# Patient Record
Sex: Male | Born: 1995 | Race: White | Hispanic: Yes | Marital: Single | Smoking: Current some day smoker
Health system: Southern US, Community
[De-identification: ages and names within clinical notes are randomized; demographics above are authoritative.]

## PROBLEM LIST (undated history)

## (undated) DIAGNOSIS — R011 Cardiac murmur, unspecified: Secondary | ICD-10-CM

## (undated) HISTORY — DX: Cardiac murmur, unspecified: R01.1

## (undated) HISTORY — PX: TONSILLECTOMY: SUR1361

---
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
## 2014-06-22 ENCOUNTER — Emergency Department (HOSPITAL_COMMUNITY)
Admission: EM | Admit: 2014-06-22 | Discharge: 2014-06-22 | Disposition: A | Discharge status: Home / Self Care | Payer: Self-pay | Attending: Emergency Medicine | Admitting: Emergency Medicine

## 2014-06-22 ENCOUNTER — Encounter (HOSPITAL_COMMUNITY): Payer: Self-pay | Admitting: Emergency Medicine

## 2014-06-22 DIAGNOSIS — Z720 Tobacco use: Secondary | ICD-10-CM | POA: Insufficient documentation

## 2014-06-22 DIAGNOSIS — R1033 Periumbilical pain: Secondary | ICD-10-CM | POA: Insufficient documentation

## 2014-06-22 DIAGNOSIS — R112 Nausea with vomiting, unspecified: Secondary | ICD-10-CM | POA: Insufficient documentation

## 2014-06-22 DIAGNOSIS — R197 Diarrhea, unspecified: Secondary | ICD-10-CM | POA: Insufficient documentation

## 2014-06-22 LAB — CBC WITH DIFFERENTIAL/PLATELET
Basophils Absolute: 0 10*3/uL (ref 0.0–0.1)
Basophils Relative: 0 % (ref 0–1)
Eosinophils Absolute: 0.1 10*3/uL (ref 0.0–0.7)
Eosinophils Relative: 1 % (ref 0–5)
HCT: 44.3 % (ref 39.0–52.0)
Hemoglobin: 15.3 g/dL (ref 13.0–17.0)
Lymphocytes Relative: 18 % (ref 12–46)
Lymphs Abs: 1.7 10*3/uL (ref 0.7–4.0)
MCH: 31.7 pg (ref 26.0–34.0)
MCHC: 34.5 g/dL (ref 30.0–36.0)
MCV: 91.9 fL (ref 78.0–100.0)
Monocytes Absolute: 0.9 10*3/uL (ref 0.1–1.0)
Monocytes Relative: 10 % (ref 3–12)
Neutro Abs: 6.5 10*3/uL (ref 1.7–7.7)
Neutrophils Relative %: 71 % (ref 43–77)
Platelets: 295 10*3/uL (ref 150–400)
RBC: 4.82 MIL/uL (ref 4.22–5.81)
RDW: 12.1 % (ref 11.5–15.5)
WBC: 9.2 10*3/uL (ref 4.0–10.5)

## 2014-06-22 LAB — BASIC METABOLIC PANEL
Anion gap: 7 (ref 5–15)
BUN: 10 mg/dL (ref 6–23)
CO2: 28 mmol/L (ref 19–32)
Calcium: 9.4 mg/dL (ref 8.4–10.5)
Chloride: 104 mmol/L (ref 96–112)
Creatinine, Ser: 0.82 mg/dL (ref 0.50–1.35)
GFR calc Af Amer: >90 mL/min (ref >90)
GFR calc non Af Amer: >90 mL/min (ref >90)
Glucose, Bld: 110 mg/dL — ABNORMAL HIGH (ref 70–99)
Potassium: 3.9 mmol/L (ref 3.5–5.1)
Sodium: 139 mmol/L (ref 135–145)

## 2014-06-22 MED ORDER — SODIUM CHLORIDE 0.9 % IV BOLUS (SEPSIS)
1000.0000 mL | Freq: Once | INTRAVENOUS | Status: AC
  Administered 2014-06-22: 1000 mL via INTRAVENOUS

## 2014-06-22 MED ORDER — KETOROLAC TROMETHAMINE 15 MG/ML IJ SOLN
15.0000 mg | Freq: Once | INTRAMUSCULAR | Status: AC
  Administered 2014-06-22: 15 mg via INTRAVENOUS
  Filled 2014-06-22: qty 1

## 2014-06-22 MED ORDER — ONDANSETRON HCL 4 MG PO TABS: 4.0000 mg | ORAL_TABLET | Freq: Four times a day (QID) | ORAL | Status: DC

## 2014-06-22 MED ORDER — ONDANSETRON HCL 4 MG/2ML IJ SOLN
4.0000 mg | Freq: Once | INTRAMUSCULAR | Status: AC
  Administered 2014-06-22: 4 mg via INTRAVENOUS
  Filled 2014-06-22: qty 2

## 2014-06-22 NOTE — Discharge Instructions (Signed)
Abdominal Pain °Many things can cause abdominal pain. Usually, abdominal pain is not caused by a disease and will improve without treatment. It can often be observed and treated at home. Your health care provider will do a physical exam and possibly order blood tests and X-rays to help determine the seriousness of your pain. However, in many cases, more time must pass before a clear cause of the pain can be found. Before that point, your health care provider may not know if you need more testing or further treatment. °HOME CARE INSTRUCTIONS  °Monitor your abdominal pain for any changes. The following actions may help to alleviate any discomfort you are experiencing: °· Only take over-the-counter or prescription medicines as directed by your health care provider. °· Do not take laxatives unless directed to do so by your health care provider. °· Try a clear liquid diet (broth, tea, or water) as directed by your health care provider. Slowly move to a bland diet as tolerated. °SEEK MEDICAL CARE IF: °· You have unexplained abdominal pain. °· You have abdominal pain associated with nausea or diarrhea. °· You have pain when you urinate or have a bowel movement. °· You experience abdominal pain that wakes you in the night. °· You have abdominal pain that is worsened or improved by eating food. °· You have abdominal pain that is worsened with eating fatty foods. °· You have a fever. °SEEK IMMEDIATE MEDICAL CARE IF:  °· Your pain does not go away within 2 hours. °· You keep throwing up (vomiting). °· Your pain is felt only in portions of the abdomen, such as the right side or the left lower portion of the abdomen. °· You pass bloody or black tarry stools. °MAKE SURE YOU: °· Understand these instructions.   °· Will watch your condition.   °· Will get help right away if you are not doing well or get worse.   °Document Released: 01/14/2005 Document Revised: 04/11/2013 Document Reviewed: 12/14/2012 °ExitCare® Patient Information  ©2015 ExitCare, LLC. This information is not intended to replace advice given to you by your health care provider. Make sure you discuss any questions you have with your health care provider. ° °Nausea and Vomiting °Nausea means you feel sick to your stomach. Throwing up (vomiting) is a reflex where stomach contents come out of your mouth. °HOME CARE  °· Take medicine as told by your doctor. °· Do not force yourself to eat. However, you do need to drink fluids. °· If you feel like eating, eat a normal diet as told by your doctor. °¨ Eat rice, wheat, potatoes, bread, lean meats, yogurt, fruits, and vegetables. °¨ Avoid high-fat foods. °· Drink enough fluids to keep your pee (urine) clear or pale yellow. °· Ask your doctor how to replace body fluid losses (rehydrate). Signs of body fluid loss (dehydration) include: °¨ Feeling very thirsty. °¨ Dry lips and mouth. °¨ Feeling dizzy. °¨ Dark pee. °¨ Peeing less than normal. °¨ Feeling confused. °¨ Fast breathing or heart rate. °GET HELP RIGHT AWAY IF:  °· You have blood in your throw up. °· You have black or bloody poop (stool). °· You have a bad headache or stiff neck. °· You feel confused. °· You have bad belly (abdominal) pain. °· You have chest pain or trouble breathing. °· You do not pee at least once every 8 hours. °· You have cold, clammy skin. °· You keep throwing up after 24 to 48 hours. °· You have a fever. °MAKE SURE YOU:  °· Understand   these instructions.  Will watch your condition.  Will get help right away if you are not doing well or get worse. Document Released: 09/23/2007 Document Revised: 06/29/2011 Document Reviewed: 09/05/2010 [facility] HExitCare Patient Information 2015 [place], [place]. This information is not intended to replace advice given to you by your health care provider. Make sure you discuss any questions you have with your health care provider.   Emergency Department Resource Guide 1) Find a Doctor and Pay Out of Pocket Although you won't  have to find out who is covered by your insurance plan, it is a good idea to ask around and get recommendations. You will then need to call the office and see if the doctor you have chosen will accept you as a new patient and what types of options they offer for patients who are self-pay. Some doctors offer discounts or will set up payment plans for their patients who do not have insurance, but you will need to ask so you aren't surprised when you get to your appointment.  2) Contact Your Local Health Department Not all health departments have doctors that can see patients for sick visits, but many do, so it is worth a call to see if yours does. If you don't know where your local health department is, you can check in your phone book. The CDC also has a tool to help you locate your state's health department, and many state websites also have listings of all of their local health departments.  3) Find a Walk-in Clinic If your illness is not likely to be very severe or complicated, you may want to try a walk in clinic. These are popping up all over the country in pharmacies, drugstores, and shopping centers. They're usually staffed by nurse practitioners or physician assistants that have been trained to treat common illnesses and complaints. They're usually fairly quick and inexpensive. However, if you have serious medical issues or chronic medical problems, these are probably not your best option.  No Primary Care Doctor: - Call Health Connect at  [phone] - they can help you locate a primary care doctor that  accepts your insurance, provides certain services, etc. - Physician Referral Service- [phone]  Chronic Pain Problems: Organization         Address  Phone   Notes  [facility]  [phone] Patients need to be referred by their primary care doctor.   Medication Assistance: Organization         Address  Phone   Notes  [facility]  [address] [place], [place] [address] [phone]) [phone] --Must be a resident of [facility] -- Must have NO insurance coverage whatsoever (no Medicaid/ Medicare, etc.) -- The pt. MUST have a primary care doctor that directs their care regularly and follows them in the community   MedAssist  [phone]   [employer]  [phone]) [phone]    Agencies that provide inexpensive medical care: Organization         Address  Phone   Notes  [facility]  [phone]   [facility]    [phone]   [facility] [address] [place], [place] [address] [phone]) [phone]   [facility] [address], [place] [phone]   [facility]    [phone]   [facility]    [phone]   [facility]  [phone]  [address], [place] Phone:  [phone], Fax:  [phone] Hours of Operation:  9 am - 6 pm, M-F.  Also accepts Medicaid/Medicare and self-pay.  [facility]  [address], [place] Phone: [phone], Fax: [phone]. Hours of Operation:  8:30 am - 5:30 pm, M-F.  Also accepts Medicaid and self-pay.  [facility] [place] [address] [place] Phone: [phone]   [facility] [address] Natasha Bence [place], [place] [phone], Ext. [phone] Mondays & Thursdays: 7-9 AM.  First 15 patients are seen on a first come, first serve basis.    Medicaid-accepting [facility] Providers:  Organization         Address  Phone   Notes  [facility] [address], [place] [phone] Also accepts self-pay patients.  [facility] [address] Laurell Josephs [place], [place]  [phone]   [facility] [address], [place] [phone]   [facility] [address] [place] [phone]     Renaye Rakers [address], [place]   [phone] Only accepts [place] patients after they have their name applied to their card.   Self-Pay (no insurance) in [facility]:  Organization         Address  Phone   Notes  Sickle Cell Patients, [facility] [address] [place], [place] [phone]   [facility] [address] [place], [place] [phone]   [facility]  [address], [place] [phone]   [facility]/Dr. Osei-Bonsu  [address], [employer] or [address], [place] [phone] Phone number for both [place] and [place] locations is the same.  [facility] [address], [place] [phone]   [facility] [address], [place] or [address] [phone]   [facility] [address], [place] [phone], phone; [phone], fax Sees patients 1st and 3rd Saturday of every month.  Must not qualify for public or private insurance (i.e. Medicaid, Medicare, [place] Health Choice, Veterans' Benefits)  Household income should be no more than 200% of the poverty level The clinic cannot treat you if you are pregnant or think you are pregnant  Sexually transmitted diseases are not treated at the clinic.    Dental Care: Organization         Address  Phone  Notes  [facility] [address] [place], [place] [phone] Accepts children up to age 54 who are enrolled in [place] or [place] Health Choice; pregnant women with a Medicaid card; and children who have applied for Medicaid or [place] Health Choice, but were declined, whose parents can pay a reduced fee at time of service.  [facility] Department of [facility]  [address], [place] [phone] Accepts children  up to age 85 who are enrolled in [place] or [place] Health Choice; pregnant women with a Medicaid card; and children who have applied for Medicaid or [place] Health Choice, but were declined, whose parents can pay a reduced fee at time of service.  [facility] Adult Dental Access PROGRAM  [address] [place], [place] [phone]  Patients are seen by appointment only. Walk-ins are not accepted. [facility] will see patients 21 years of age and older. Monday - Tuesday (8am-5pm) Most Wednesdays (8:30-5pm) $30 per visit, cash only  [facility] Access PRO[facility]  [address], [facility] [place] [phone] Patients are seen by appointment only. Walk-ins are not accepted. [facility] will see patients 21 years of age and older. One Wednesday Evening (Monthly: Volunteer Based).  $30 per visit, cash only  [employer] [facility]  [phone]) [phone] for adults; Children under age 194, call Graduate Pediatric Dentistry at [phone]) [phone]. Children aged 114-14, please call [phone] to request a pediatric application.  Dental services are provided in all areas of dental care including fillings, crowns and bridges, complete and partial dentures, implants, gum treatment, root canals, and extractions. Preventive care is also provided. Treatment is provided to both adults and children. Patients are selected via a lottery and there is often a waiting list.   [facility] [address], [place]  [phone] [email or web address]   [facility] [address], [place], [place] [phone], Ext. [phone] Second and Fourth Thursday of each month, opens at 6:30 AM; Clinic ends at 9 AM.  Patients are seen on a first-come first-served basis, and a limited number are seen during each clinic.   [facility]  [address], [place], [place] [phone]) [phone]   Eligibility Requirements You must have lived in [place], [place], or [place] counties for at least the last  three months.   You cannot be eligible for state or federal sponsored National Cityhealthcare insurance, including CIGNAVeterans Administration, [place]icaid, or [employer] EntertainmentMedicare.   You generally cannot be eligible for healthcare insurance through your employer.    How to apply: Eligibility screenings are held every Tuesday and Wednesday afternoon from 1:00 pm until 4:00 pm. You do not need an appointment for the interview!  [facility] [address], [place], [place] [phone]   [facility]  [phone]   [facility]  [phone]   [facility]  [phone]    Behavioral Health Resources in the Community: Intensive Outpatient Programs Organization         Address  Phone  Notes  [facility] [address], [place], [place] [phone]   [facility] [address], [place], [place] [phone]   [facility] [address], [place], [place]  [phone]   [facility] [address],  [place], [place] [phone] or [phone]   Substance Abuse Resources Organization         Address  Phone  Notes  [facility]  [phone]   [facility]  [phone]   [facility]  [phone]   [facility]  [phone]   Residential & Outpatient Substance Abuse Program  [phone]   Psychological Services Organization         Address  Phone  Notes  [facility]  [phone]   [facility]  [phone]   [facility] [address], [place] [phone] or [phone]    Mobile Crisis Teams Organization         Address  Phone  Notes  [facility], Mobile Crisis Care Unit  [phone]   [facility]otherapeutic Services  [address] [place], [place] [phone]   [facility] [address], [place] 4118  [place] [phone]     Self-Help/Support Groups Organization         Address  Phone             Notes  [facility] [place] - variety of support groups  Kila Call for more information  Narcotics Anonymous (NA), [facility] [address], [employer] [place]  2 meetings at this location   Special educational needs teacher         Address  Phone  Notes  ASAP Residential Treatment Halfway,    [place]  [phone]   [facility]  [address], [place] [phone], [place], [place]   [place], [place] [phone] Admissions: 8am-3pm M-F  Incentives Substance [place] [address],    [place], [place] [phone]   [facility] [address] [place], [place], [place]   [facility] [address]  [place], [place]   [facility] Programs - Intensive Outpatient [address], [place], [place]   [facility] ([place] [address].,  [place], [place] [phone] or [phone]   [facility] (RTS) [address] [place], [place] Accepts Medicaid  [facility] [place] [address]  [place] [phone] Substance Abuse/Addiction Treatment   [facility] Organization         Address  Phone  Notes  [facility]  [phone]   Domenic Schwab, PhD [address] Arlis Porta [place], [place]   [phone] or [phone]   [place], [place] [phone]   [facility] [address] [place], [place] [phone] Insurance/Medicaid/sponsorship through [facility] and Families [address]                                    [place], [place] [phone] [place] [address], [place] [phone]    Dr. Adele Schilder  [phone]   Free  [facility] of [place] Dept. 1) [address], [place] 2) [place] 3)  [address], [place] [phone]  [phone]   [place] [phone] or [phone] (After Hours)

## 2014-06-22 NOTE — ED Notes (Signed)
Pt arrived with father to ED for evaluation of abd pain and vomiting. Pt stated that his abdominal pain stared Monday and then Wednesday he started vomiting. Stated that he vomited atleast 4 times over night. Denies any diarrhea. Also stated that he vomited last night around 7 and after he vomited his upper abd felt really cold.

## 2014-06-23 NOTE — ED Provider Notes (Signed)
CSN: [id number]     Arrival date & time 06/22/14  0941 History   First MD Initiated Contact with Patient 06/22/14 0945     Chief Complaint  Patient presents with  . Emesis  . Abdominal Pain     (Consider location/radiation/quality/duration/timing/severity/associated sxs/prior Treatment) Patient is a 19 y.o. male presenting with vomiting and abdominal pain. The history is provided by the patient.  Emesis Severity:  Mild Duration:  4 days Timing:  Intermittent Quality:  Stomach contents Chronicity:  New Recent urination:  Normal Context: not post-tussive and not self-induced   Relieved by:  Nothing Worsened by:  Food smell Associated symptoms: abdominal pain and diarrhea   Associated symptoms: no chills, no fever, no myalgias, no sore throat and no URI   Risk factors: no alcohol use, no diabetes, no sick contacts, no suspect food intake and no travel to endemic areas   Abdominal Pain Associated symptoms: diarrhea and vomiting   Associated symptoms: no chills and no sore throat     History reviewed. No pertinent past medical history. Past Surgical History  Procedure Laterality Date  . Tonsillectomy     No family history on file. History  Substance Use Topics  . Smoking status: Current Every Day Smoker -- 0.50 packs/day    Types: Cigarettes  . Smokeless tobacco: Not on file  . Alcohol Use: No    Review of Systems  Constitutional: Negative for chills.  HENT: Negative for sore throat.   Gastrointestinal: Positive for vomiting, abdominal pain and diarrhea.  Musculoskeletal: Negative for myalgias.    All systems reviewed and negative, other than as noted in HPI.   Allergies  Review of patient's allergies indicates no known allergies.  Home Medications   Prior to Admission medications   Medication Sig Start Date End Date Taking? Authorizing Provider  ibuprofen (ADVIL,MOTRIN) 200 MG tablet Take 200 mg by mouth every 6 (six) hours as needed.   Yes Historical Provider,  MD  ondansetron (ZOFRAN) 4 MG tablet Take 1 tablet (4 mg total) by mouth every 6 (six) hours. 06/22/14   Raeford RazorStephen Emannuel Vise, MD   BP 136/66 mmHg  Pulse 74  Temp(Src) 98.7 F (37.1 C) (Oral)  Resp 18  SpO2 98% Physical Exam  Constitutional: He appears well-developed and well-nourished. No distress.  HENT:  Head: Normocephalic and atraumatic.  Eyes: Conjunctivae are normal. Right eye exhibits no discharge. Left eye exhibits no discharge.  Neck: Neck supple.  Cardiovascular: Normal rate, regular rhythm and normal heart sounds.  Exam reveals no gallop and no friction rub.   No murmur heard. Pulmonary/Chest: Effort normal and breath sounds normal. No respiratory distress.  Abdominal: Soft. He exhibits no distension. There is no tenderness.  Musculoskeletal: He exhibits no edema or tenderness.  No cva tenderness  Neurological: He is alert.  Skin: Skin is warm and dry.  Psychiatric: He has a normal mood and affect. His behavior is normal. Thought content normal.  Nursing note and vitals reviewed.   ED Course  Procedures (including critical care time) Labs Review Labs Reviewed  BASIC METABOLIC PANEL - Abnormal; Notable for the following:    Glucose, Bld 110 (*)    All other components within normal limits  CBC WITH DIFFERENTIAL/PLATELET    Imaging Review No results found.   EKG Interpretation None      MDM   Final diagnoses:  Non-intractable vomiting with nausea, vomiting of unspecified type  Periumbilical abdominal pain    18yM with abdominal pain and n/v. Abdominal exam  is pretty benign. Afebrile. HD stable. I generally have a very low suspicion for appendicitis or other acute surgical process. May be viral enteritis. Labs without significant metabolic derangement. Feels better after IVF/zofran. I feel safe for DC. REturn precautions discussed.     Raeford Razor, MD 06/23/14 [phone]

## 2014-08-08 ENCOUNTER — Emergency Department (HOSPITAL_COMMUNITY): Payer: Self-pay

## 2014-08-08 ENCOUNTER — Emergency Department (HOSPITAL_COMMUNITY)
Admission: EM | Admit: 2014-08-08 | Discharge: 2014-08-08 | Disposition: A | Discharge status: Home / Self Care | Payer: Self-pay | Attending: Emergency Medicine | Admitting: Emergency Medicine

## 2014-08-08 ENCOUNTER — Encounter (HOSPITAL_COMMUNITY): Payer: Self-pay | Admitting: Emergency Medicine

## 2014-08-08 DIAGNOSIS — R112 Nausea with vomiting, unspecified: Secondary | ICD-10-CM | POA: Insufficient documentation

## 2014-08-08 DIAGNOSIS — R1013 Epigastric pain: Secondary | ICD-10-CM | POA: Insufficient documentation

## 2014-08-08 DIAGNOSIS — R1011 Right upper quadrant pain: Secondary | ICD-10-CM | POA: Insufficient documentation

## 2014-08-08 DIAGNOSIS — Z720 Tobacco use: Secondary | ICD-10-CM | POA: Insufficient documentation

## 2014-08-08 DIAGNOSIS — R197 Diarrhea, unspecified: Secondary | ICD-10-CM | POA: Insufficient documentation

## 2014-08-08 LAB — COMPREHENSIVE METABOLIC PANEL
ALBUMIN: 4.3 g/dL (ref 3.5–5.2)
ALT: 18 U/L (ref 0–53)
AST: 18 U/L (ref 0–37)
Alkaline Phosphatase: 76 U/L (ref 39–117)
Anion gap: 10 (ref 5–15)
BILIRUBIN TOTAL: 0.8 mg/dL (ref 0.3–1.2)
BUN: 13 mg/dL (ref 6–23)
CHLORIDE: 102 mmol/L (ref 96–112)
CO2: 26 mmol/L (ref 19–32)
Calcium: 9.4 mg/dL (ref 8.4–10.5)
Creatinine, Ser: 0.89 mg/dL (ref 0.50–1.35)
GFR calc Af Amer: >90 mL/min (ref >90)
GFR calc non Af Amer: >90 mL/min (ref >90)
Glucose, Bld: 108 mg/dL — ABNORMAL HIGH (ref 70–99)
Potassium: 4.5 mmol/L (ref 3.5–5.1)
SODIUM: 138 mmol/L (ref 135–145)
Total Protein: 7.1 g/dL (ref 6.0–8.3)

## 2014-08-08 LAB — CBC WITH DIFFERENTIAL/PLATELET
BASOS PCT: 0 % (ref 0–1)
Basophils Absolute: 0 10*3/uL (ref 0.0–0.1)
EOS ABS: 0.3 10*3/uL (ref 0.0–0.7)
Eosinophils Relative: 4 % (ref 0–5)
HEMATOCRIT: 45.8 % (ref 39.0–52.0)
HEMOGLOBIN: 15.9 g/dL (ref 13.0–17.0)
LYMPHS ABS: 2.9 10*3/uL (ref 0.7–4.0)
Lymphocytes Relative: 35 % (ref 12–46)
MCH: 31.5 pg (ref 26.0–34.0)
MCHC: 34.7 g/dL (ref 30.0–36.0)
MCV: 90.7 fL (ref 78.0–100.0)
MONO ABS: 0.8 10*3/uL (ref 0.1–1.0)
MONOS PCT: 10 % (ref 3–12)
Neutro Abs: 4.4 10*3/uL (ref 1.7–7.7)
Neutrophils Relative %: 51 % (ref 43–77)
Platelets: 314 10*3/uL (ref 150–400)
RBC: 5.05 MIL/uL (ref 4.22–5.81)
RDW: 11.8 % (ref 11.5–15.5)
WBC: 8.5 10*3/uL (ref 4.0–10.5)

## 2014-08-08 LAB — LIPASE, BLOOD: Lipase: 26 U/L (ref 11–59)

## 2014-08-08 MED ORDER — IOHEXOL 300 MG/ML  SOLN
25.0000 mL | Freq: Once | INTRAMUSCULAR | Status: AC | PRN
  Administered 2014-08-08: 25 mL via ORAL

## 2014-08-08 MED ORDER — ONDANSETRON HCL 4 MG/2ML IJ SOLN
4.0000 mg | Freq: Once | INTRAMUSCULAR | Status: AC
  Administered 2014-08-08: 4 mg via INTRAVENOUS
  Filled 2014-08-08: qty 2

## 2014-08-08 MED ORDER — TRAMADOL HCL 50 MG PO TABS: 50.0000 mg | ORAL_TABLET | Freq: Four times a day (QID) | ORAL | Status: DC | PRN

## 2014-08-08 MED ORDER — IOHEXOL 300 MG/ML  SOLN
100.0000 mL | Freq: Once | INTRAMUSCULAR | Status: AC | PRN
  Administered 2014-08-08: 100 mL via INTRAVENOUS

## 2014-08-08 MED ORDER — SODIUM CHLORIDE 0.9 % IV BOLUS (SEPSIS)
1000.0000 mL | Freq: Once | INTRAVENOUS | Status: AC
  Administered 2014-08-08: 1000 mL via INTRAVENOUS

## 2014-08-08 NOTE — ED Notes (Signed)
Pt states that he has been having emesis and abdominal pain for 2 days. Pt states that he was feeling better yesterday, so he ate a cheeseburger, but he woke up with worse pain this AM.

## 2014-08-08 NOTE — ED Notes (Signed)
Pt verbalized understanding of d/c instructions and has no further questions.  

## 2014-08-08 NOTE — ED Provider Notes (Signed)
CSN: [id number]     Arrival date & time 08/08/14  2536 History   First MD Initiated Contact with Patient 08/08/14 0455     Chief Complaint  Patient presents with  . Abdominal Pain     (Consider location/radiation/quality/duration/timing/severity/associated sxs/prior Treatment) HPI Comments: Patient is a 19 year old male who presents for evaluation of right-sided abdominal pain. This started several hours prior to presentation. For the past 2 or 3 days he reports vomiting, nausea, and loose stools. He woke up this morning with the pain. He denies any fevers or chills. He had a similar episode one month ago for which she was seen here and discharged.  Patient is a 19 y.o. male presenting with abdominal pain. The history is provided by the patient.  Abdominal Pain Pain location:  Epigastric and RUQ Pain quality: cramping   Pain radiates to:  Does not radiate Pain severity:  Moderate Onset quality:  Sudden Duration:  3 hours Timing:  Constant Progression:  Worsening Chronicity:  New Relieved by:  Nothing Worsened by:  Nothing tried Ineffective treatments:  None tried Associated symptoms: diarrhea, nausea and vomiting   Associated symptoms: no constipation and no fever     History reviewed. No pertinent past medical history. Past Surgical History  Procedure Laterality Date  . Tonsillectomy     History reviewed. No pertinent family history. History  Substance Use Topics  . Smoking status: Current Some Day Smoker -- 0.50 packs/day    Types: Cigarettes  . Smokeless tobacco: Not on file  . Alcohol Use: No    Review of Systems  Constitutional: Negative for fever.  Gastrointestinal: Positive for nausea, vomiting, abdominal pain and diarrhea. Negative for constipation.  All other systems reviewed and are negative.     Allergies  Review of patient's allergies indicates no known allergies.  Home Medications   Prior to Admission medications   Medication Sig Start Date End  Date Taking? Authorizing Provider  ibuprofen (ADVIL,MOTRIN) 200 MG tablet Take 200 mg by mouth every 6 (six) hours as needed.    Historical Provider, MD  ondansetron (ZOFRAN) 4 MG tablet Take 1 tablet (4 mg total) by mouth every 6 (six) hours. 06/22/14   Raeford Razor, MD   BP 141/75 mmHg  Pulse 72  Temp(Src) 98.4 F (36.9 C) (Oral)  Resp 18  Ht  (1.93 m)  Wt 333 lb 12.8 oz (151.411 kg)  BMI 40.65 kg/m2  SpO2 100% Physical Exam  Constitutional: He is oriented to person, place, and time. He appears well-developed and well-nourished. No distress.  HENT:  Head: Normocephalic and atraumatic.  Mouth/Throat: Oropharynx is clear and moist.  Neck: Normal range of motion. Neck supple.  Cardiovascular: Normal rate, regular rhythm and normal heart sounds.   No murmur heard. Pulmonary/Chest: Effort normal and breath sounds normal. No respiratory distress. He has no wheezes.  Abdominal: Soft. Bowel sounds are normal. He exhibits no distension. There is tenderness. There is no rebound and no guarding.  Abdomen is obese. There is tenderness to palpation in the right upper quadrant.  Musculoskeletal: Normal range of motion. He exhibits no edema.  Lymphadenopathy:    He has no cervical adenopathy.  Neurological: He is alert and oriented to person, place, and time.  Skin: Skin is warm and dry. He is not diaphoretic.  Nursing note and vitals reviewed.   ED Course  Procedures (including critical care time) Labs Review Labs Reviewed  COMPREHENSIVE METABOLIC PANEL  CBC WITH DIFFERENTIAL/PLATELET  LIPASE, BLOOD  Imaging Review No results found.   EKG Interpretation None      MDM   Final diagnoses:  None    CT scan and laboratory studies are all reassuring and give no explanation for the patient's discomfort. He appears very comfortable in his abdominal exam is reassuring. He does have mild tenderness in the right upper quadrant but it does not appear acutely ill. He will be  discharged to home with pain medication and when necessary follow-up with GI.    Geoffery Lyonsouglas Ezio Wieck, MD 08/08/14 [phone]

## 2014-08-08 NOTE — ED Notes (Signed)
Delo ,MD at bedside 

## 2014-08-08 NOTE — ED Notes (Signed)
CT notified that the pt has finished his contrast.  

## 2014-08-08 NOTE — Discharge Instructions (Signed)
Tramadol as prescribed as needed for pain.  Follow-up with Dr. Loreta AveMann if not improving in the next several days. The contact information for her office is been provided in this discharge summary. Call to arrange this appointment.  Return to the emergency department if your symptoms significantly worsen or change.   Abdominal Pain Many things can cause abdominal pain. Usually, abdominal pain is not caused by a disease and will improve without treatment. It can often be observed and treated at home. Your health care provider will do a physical exam and possibly order blood tests and X-rays to help determine the seriousness of your pain. However, in many cases, more time must pass before a clear cause of the pain can be found. Before that point, your health care provider may not know if you need more testing or further treatment. HOME CARE INSTRUCTIONS  Monitor your abdominal pain for any changes. The following actions may help to alleviate any discomfort you are experiencing:  Only take over-the-counter or prescription medicines as directed by your health care provider.  Do not take laxatives unless directed to do so by your health care provider.  Try a clear liquid diet (broth, tea, or water) as directed by your health care provider. Slowly move to a bland diet as tolerated. SEEK MEDICAL CARE IF:  You have unexplained abdominal pain.  You have abdominal pain associated with nausea or diarrhea.  You have pain when you urinate or have a bowel movement.  You experience abdominal pain that wakes you in the night.  You have abdominal pain that is worsened or improved by eating food.  You have abdominal pain that is worsened with eating fatty foods.  You have a fever. SEEK IMMEDIATE MEDICAL CARE IF:   Your pain does not go away within 2 hours.  You keep throwing up (vomiting).  Your pain is felt only in portions of the abdomen, such as the right side or the left lower portion of the  abdomen.  You pass bloody or black tarry stools. MAKE SURE YOU:  Understand these instructions.   Will watch your condition.   Will get help right away if you are not doing well or get worse.  Document Released: 01/14/2005 Document Revised: 04/11/2013 Document Reviewed: 12/14/2012 [facility] Patient Information 2015 [employer] [place]. This information is not intended to replace advice given to you by your health care provider. Make sure you discuss any questions you have with your health care provider.

## 2014-09-06 ENCOUNTER — Encounter (HOSPITAL_COMMUNITY): Payer: Self-pay | Admitting: *Deleted

## 2014-09-06 ENCOUNTER — Emergency Department (HOSPITAL_COMMUNITY)
Admission: EM | Admit: 2014-09-06 | Discharge: 2014-09-06 | Disposition: A | Discharge status: Home / Self Care | Payer: Self-pay | Attending: Emergency Medicine | Admitting: Emergency Medicine

## 2014-09-06 DIAGNOSIS — Z720 Tobacco use: Secondary | ICD-10-CM | POA: Insufficient documentation

## 2014-09-06 DIAGNOSIS — Y9389 Activity, other specified: Secondary | ICD-10-CM | POA: Insufficient documentation

## 2014-09-06 DIAGNOSIS — S61012A Laceration without foreign body of left thumb without damage to nail, initial encounter: Secondary | ICD-10-CM | POA: Insufficient documentation

## 2014-09-06 DIAGNOSIS — Y288XXA Contact with other sharp object, undetermined intent, initial encounter: Secondary | ICD-10-CM | POA: Insufficient documentation

## 2014-09-06 DIAGNOSIS — Z23 Encounter for immunization: Secondary | ICD-10-CM | POA: Insufficient documentation

## 2014-09-06 DIAGNOSIS — Y929 Unspecified place or not applicable: Secondary | ICD-10-CM | POA: Insufficient documentation

## 2014-09-06 DIAGNOSIS — Y990 Civilian activity done for income or pay: Secondary | ICD-10-CM | POA: Insufficient documentation

## 2014-09-06 MED ORDER — TETANUS-DIPHTH-ACELL PERTUSSIS 5-2.5-18.5 LF-MCG/0.5 IM SUSP
0.5000 mL | Freq: Once | INTRAMUSCULAR | Status: AC
  Administered 2014-09-06: 0.5 mL via INTRAMUSCULAR
  Filled 2014-09-06: qty 0.5

## 2014-09-06 MED ORDER — BACITRACIN 500 UNIT/GM EX OINT
1.0000 | TOPICAL_OINTMENT | Freq: Two times a day (BID) | CUTANEOUS | Status: DC
Start: 2014-09-06 — End: 2014-09-06
  Administered 2014-09-06: 1 via TOPICAL

## 2014-09-06 MED ORDER — LIDOCAINE HCL (PF) 1 % IJ SOLN
INTRAMUSCULAR | Status: AC
  Administered 2014-09-06: 5 mL
  Filled 2014-09-06: qty 5

## 2014-09-06 NOTE — ED Notes (Signed)
NP in with pt. Suturing finger.

## 2014-09-06 NOTE — ED Provider Notes (Signed)
CSN: [id number]     Arrival date & time 09/06/14  1737 History  This chart was scribed for [facility]ese, NP, working with Arby BarretteMarcy Pfeiffer, MD by Elon SpannerGarrett Cook, ED Scribe. This patient was seen in room TR05C/TR05C and the patient's care was started at 6:14 PM.   Chief Complaint  Patient presents with  . Finger Injury   The history is provided by the patient. No language interpreter was used.   HPI Comments: Hunter Sheppard is a 19 y.o. male who presents to the Emergency Department complaining of a finger laceration on the dorsal surface of the left hand thumb.  He reports he was at work using a box cutter when, while trying to catch a falling object, he cut his thumb with the box cutter.  He has applied pressure and bleeding is currently controlled.  He has FROM of the thumb.  Patient is unsure if he is UTD on Tetanus.  He denies other injury.    History reviewed. No pertinent past medical history. Past Surgical History  Procedure Laterality Date  . Tonsillectomy     History reviewed. No pertinent family history. History  Substance Use Topics  . Smoking status: Current Some Day Smoker -- 0.50 packs/day    Types: Cigarettes  . Smokeless tobacco: Not on file  . Alcohol Use: No    Review of Systems  Skin: Positive for wound.       Laceration left thumb   All other systems reviewed and are negative.     Allergies  Review of patient's allergies indicates no known allergies.  Home Medications   Prior to Admission medications   Medication Sig Start Date End Date Taking? Authorizing Provider  ibuprofen (ADVIL,MOTRIN) 200 MG tablet Take 200 mg by mouth every 6 (six) hours as needed for moderate pain.     Historical Provider, MD  ondansetron (ZOFRAN) 4 MG tablet Take 1 tablet (4 mg total) by mouth every 6 (six) hours. Patient taking differently: Take 4 mg by mouth every 6 (six) hours as needed for nausea.  06/22/14   Raeford RazorStephen Kohut, MD  traMADol (ULTRAM) 50 MG tablet Take 1 tablet (50 mg  total) by mouth every 6 (six) hours as needed. 08/08/14   Geoffery Lyonsouglas Delo, MD   BP 155/93 mmHg  Pulse 98  Temp(Src) 98.9 F (37.2 C) (Oral)  Resp 18  SpO2 99% Physical Exam  Constitutional: He is oriented to person, place, and time. He appears well-developed and well-nourished. No distress.  HENT:  Head: Normocephalic and atraumatic.  Eyes: Conjunctivae and EOM are normal.  Neck: Neck supple. No tracheal deviation present.  Cardiovascular: Normal rate.   Pulmonary/Chest: Effort normal. No respiratory distress.  Musculoskeletal: Normal range of motion.       Left hand: He exhibits tenderness and laceration. He exhibits normal range of motion, normal capillary refill, no deformity and no swelling. Normal sensation noted. Normal strength noted.       Hands: Neurological: He is alert and oriented to person, place, and time.  Skin: Skin is warm and dry.  Psychiatric: He has a normal mood and affect. His behavior is normal.  Nursing note and vitals reviewed.   ED Course  Procedures (including critical care time)  DIAGNOSTIC STUDIES: Oxygen Saturation is 99% on RA, normal by my interpretation.    COORDINATION OF CARE:  6:14 PM Discussed treatment plan with patient at bedside.  Patient acknowledges and agrees with plan.    LACERATION REPAIR PROCEDURE NOTE The patient's  identification was confirmed and consent was obtained. This procedure was performed by Janne NapoleonHope M Neese, NP, at 7:46 PM. Site: left thumb Sterile procedures observed Anesthetic used Lidocaine 1% without epinephrine Suture type/size:4-0 prolene Length:1.5 cm # of Sutures: 3 Technique:interrupted Complexity simple Antibx ointment applied Tetanus UTD or ordered Site anesthetized, irrigated with NS, explored without evidence of foreign body, wound well approximated, site covered with sterile dressing.  Patient tolerated procedure well without complications. Instructions for care discussed verbally and patient provided  with additional written instructions for homecare and f/u.   MDM  19 y.o. male with laceration of the left thumb stable for d/c without neurovascular deficits. He will follow up in one week for suture removal or sooner for problems.   Final diagnoses:  Laceration of thumb, left, initial encounter   I personally performed the services described in this documentation, which was scribed in my presence. The recorded information has been reviewed and is accurate.    PiffardHope M Neese, NP 09/06/14 1954  Arby BarretteMarcy Pfeiffer, MD 09/13/14 [phone]

## 2014-09-06 NOTE — ED Notes (Signed)
Patient is alert and orientedx4.  Patient was explained discharge instructions and they understood them with no questions.   

## 2014-09-06 NOTE — ED Notes (Signed)
Pt to ED from work c/o finger laceration to L thumb. 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 CMS intact. Pt also reports L hand pain

## 2016-02-22 IMAGING — CT CT ABD-PELV W/ CM
2 of 4 series · 17 of 46 positions shown, 19 images · IV contrast (Omni 300)
Comparison: None.

CLINICAL DATA: Right upper quadrant pain with nausea and vomiting
for 2 days.

EXAM:
CT ABDOMEN AND PELVIS WITH CONTRAST
TECHNIQUE: Multidetector CT imaging of the abdomen and pelvis was performed
using the standard protocol following bolus administration of
intravenous contrast.
CONTRAST:  100mL OMNIPAQUE IOHEXOL 300 MG/ML  SOLN

[Series 2: abd/ pelvis 5.0 i30f 1 · axial · 0.98mm/px · z∈[+812,+1302]mm · 14 of 108 slices shown, 16 images]
[im 5/108  soft-tissue]
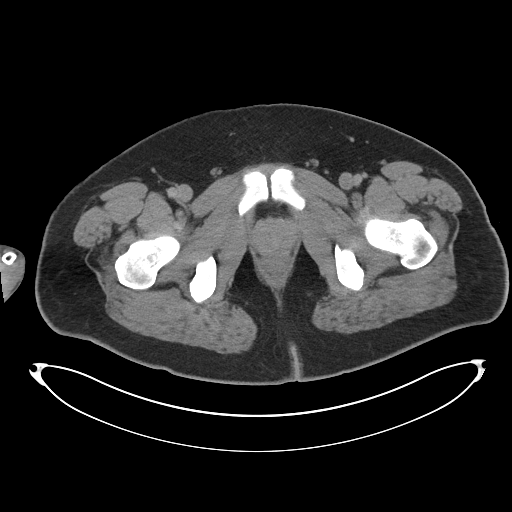
[im 5/108  bone]
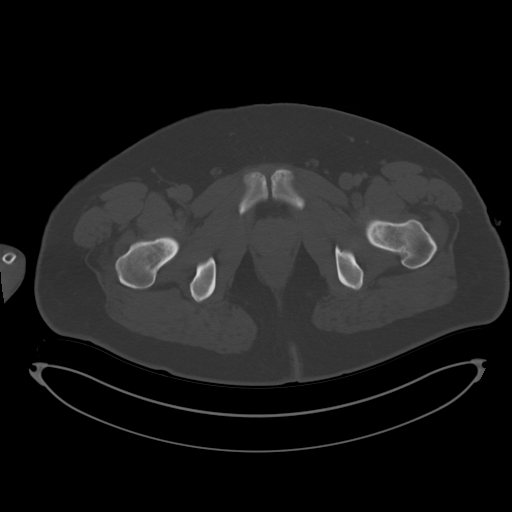
[im 14/108  soft-tissue]
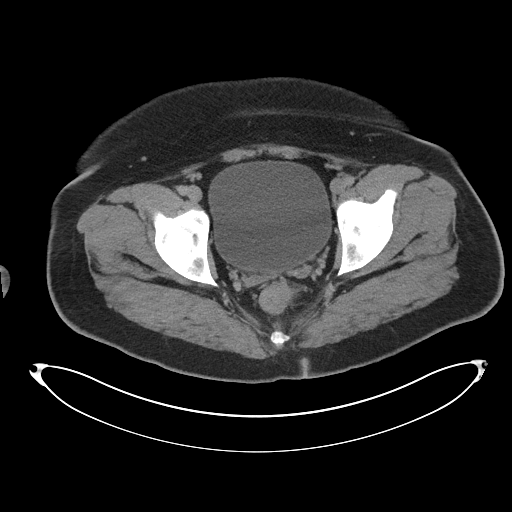
[im 23/108  soft-tissue]
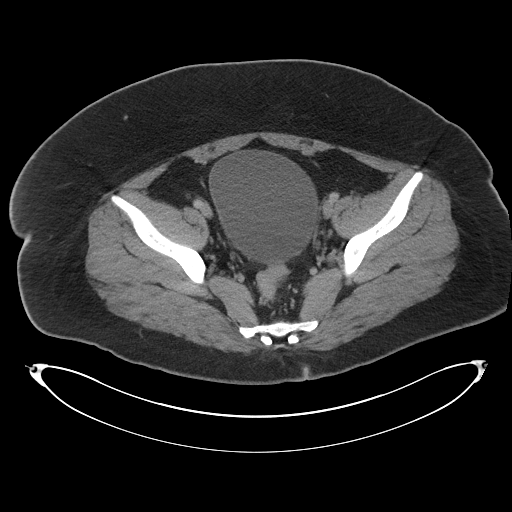
[im 27/108  soft-tissue]
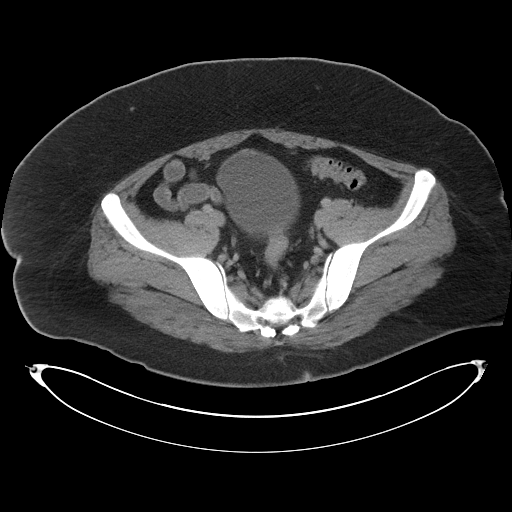
[im 36/108  soft-tissue]
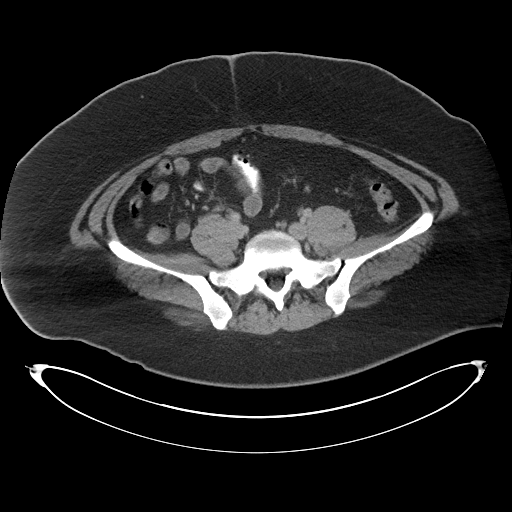
[im 45/108  soft-tissue]
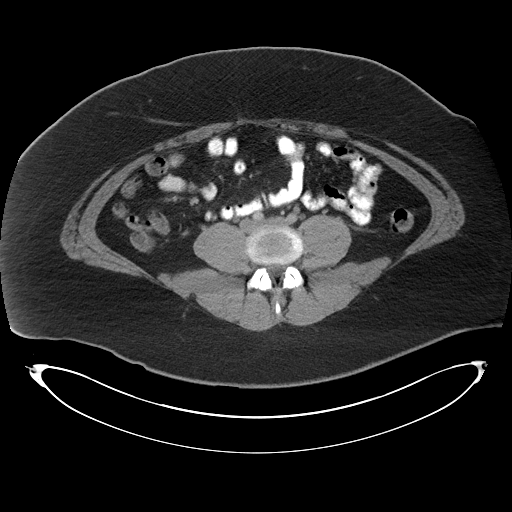
[im 50/108  soft-tissue]
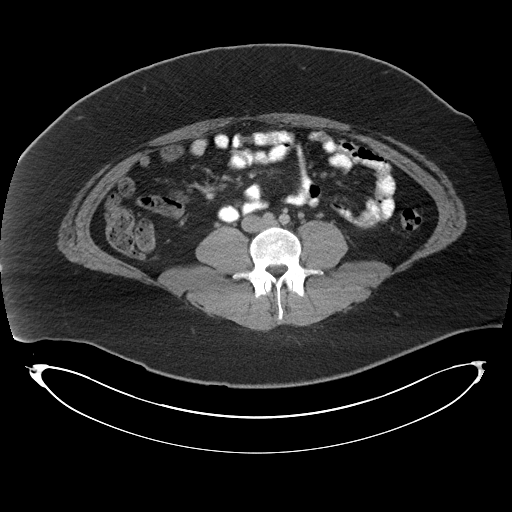
[im 58/108  soft-tissue]
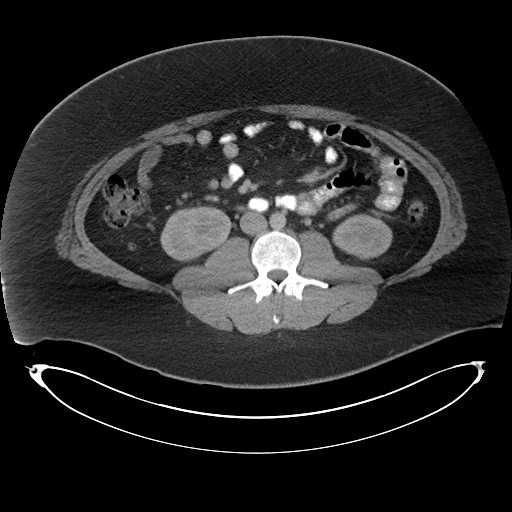
[im 63/108  soft-tissue]
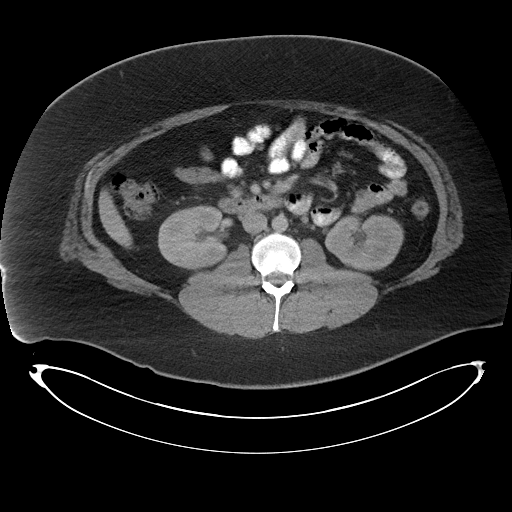
[im 63/108  bone]
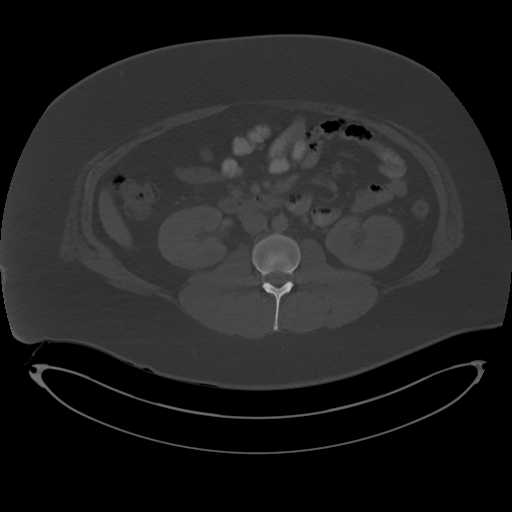
[im 72/108  soft-tissue]
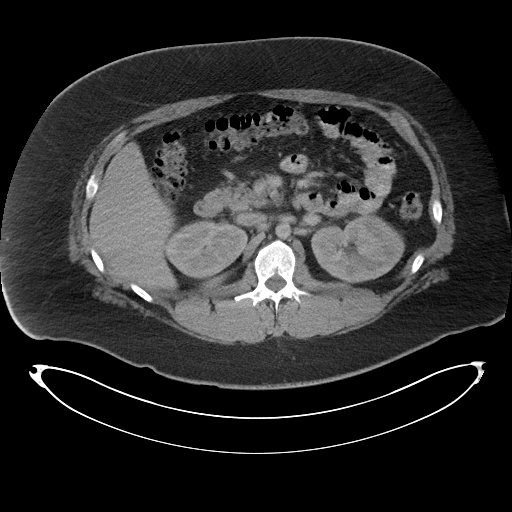
[im 81/108  soft-tissue]
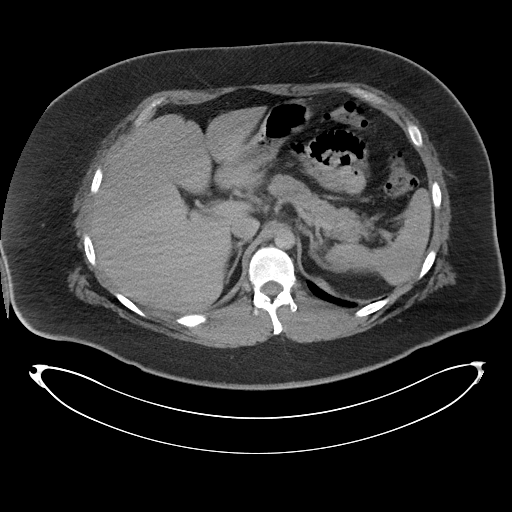
[im 85/108  soft-tissue]
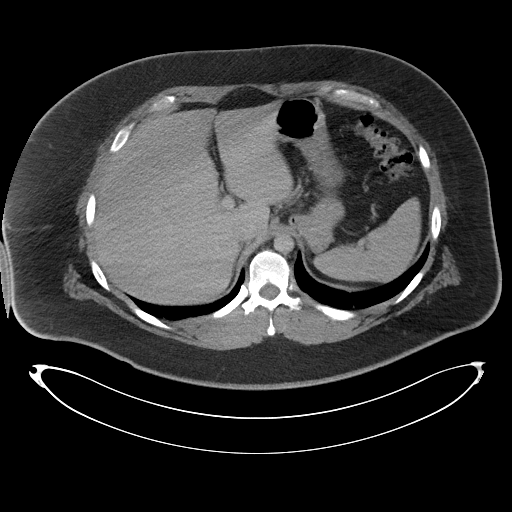
[im 94/108  soft-tissue]
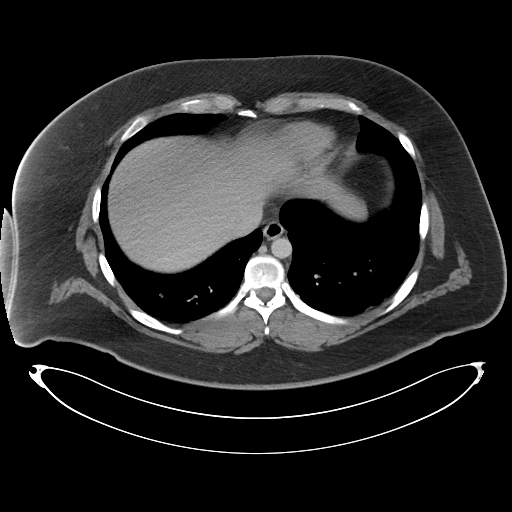
[im 103/108  soft-tissue]
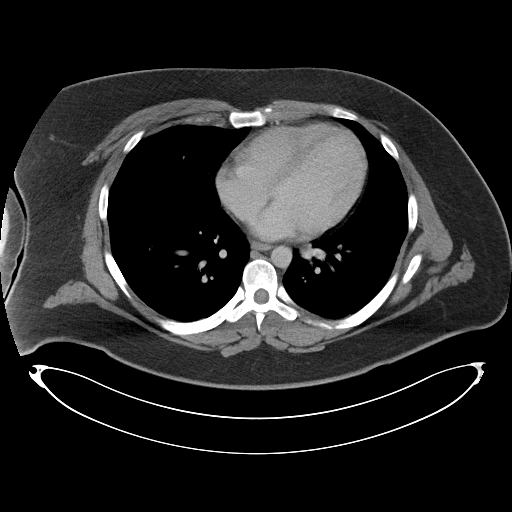

[Series 5: coronals · coronal · 0.97mm/px · 3 of 179 slices shown]
[im 60/179  soft-tissue]
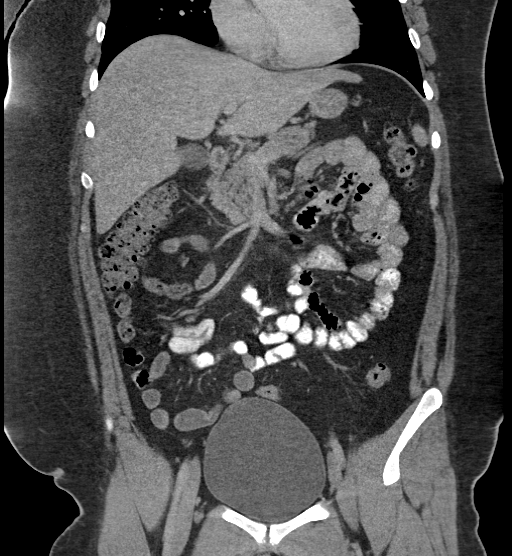
[im 80/179  soft-tissue]
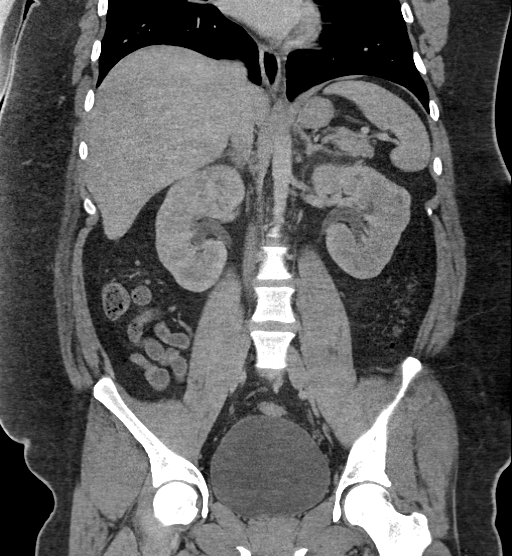
[im 99/179  soft-tissue]
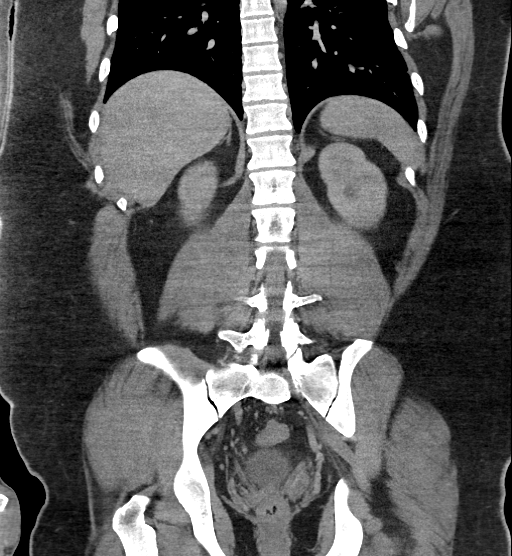

[17 of 46 positions shown; findings below may reference images not displayed]

FINDINGS: BODY WALL: No contributory findings.

LOWER CHEST: No contributory findings.

ABDOMEN/PELVIS:

Liver: No focal abnormality.

Biliary: No evidence of biliary obstruction or stone.

Pancreas: Unremarkable.

Spleen: Unremarkable.

Adrenals: Unremarkable.

Kidneys and ureters: No hydronephrosis or stone. 1 cm presumed cyst
in the interpolar left kidney.

Bladder: Unremarkable.

Reproductive: No pathologic findings.

Bowel: No obstruction. Normal appendix.

Retroperitoneum: No mass or adenopathy.

Peritoneum: No ascites or pneumoperitoneum.

Vascular: No acute abnormality.

OSSEOUS: No acute abnormalities.
IMPRESSION: No explanation for abdominal pain.

## 2020-01-03 ENCOUNTER — Other Ambulatory Visit: Payer: Self-pay

## 2020-01-03 ENCOUNTER — Ambulatory Visit (HOSPITAL_COMMUNITY)
Admission: EM | Admit: 2020-01-03 | Discharge: 2020-01-03 | Disposition: A | Discharge status: Home / Self Care | Payer: HRSA Program | Attending: Family Medicine | Admitting: Family Medicine

## 2020-01-03 ENCOUNTER — Encounter (HOSPITAL_COMMUNITY): Payer: Self-pay

## 2020-01-03 DIAGNOSIS — Z20822 Contact with and (suspected) exposure to covid-19: Secondary | ICD-10-CM | POA: Insufficient documentation

## 2020-01-03 DIAGNOSIS — R112 Nausea with vomiting, unspecified: Secondary | ICD-10-CM | POA: Insufficient documentation

## 2020-01-03 DIAGNOSIS — Z79899 Other long term (current) drug therapy: Secondary | ICD-10-CM | POA: Insufficient documentation

## 2020-01-03 DIAGNOSIS — R1013 Epigastric pain: Secondary | ICD-10-CM | POA: Insufficient documentation

## 2020-01-03 DIAGNOSIS — R197 Diarrhea, unspecified: Secondary | ICD-10-CM | POA: Diagnosis not present

## 2020-01-03 DIAGNOSIS — R109 Unspecified abdominal pain: Secondary | ICD-10-CM | POA: Insufficient documentation

## 2020-01-03 DIAGNOSIS — F17210 Nicotine dependence, cigarettes, uncomplicated: Secondary | ICD-10-CM | POA: Diagnosis not present

## 2020-01-03 DIAGNOSIS — R531 Weakness: Secondary | ICD-10-CM | POA: Insufficient documentation

## 2020-01-03 LAB — SARS CORONAVIRUS 2 (TAT 6-24 HRS): SARS Coronavirus 2: NEGATIVE

## 2020-01-03 MED ORDER — PANTOPRAZOLE SODIUM 40 MG PO TBEC: 40.0000 mg | DELAYED_RELEASE_TABLET | Freq: Every day | ORAL | 0 refills | Status: DC

## 2020-01-03 MED ORDER — ONDANSETRON HCL 4 MG PO TABS: 4.0000 mg | ORAL_TABLET | Freq: Four times a day (QID) | ORAL | 0 refills | Status: DC | PRN

## 2020-01-03 NOTE — ED Provider Notes (Signed)
[facility]    CSN: [id number] Arrival date & time: 01/03/20  0946      History   Chief Complaint Chief Complaint  Patient presents with  . Abdominal Pain    HPI TALIB HEADLEY is a 24 y.o. male.   Started with epigastric pain, N/V/D, sweats, and weakness worsening over the past 2-3 days. Taking pepto bismol with some temporary relief. Keeping fluids down, but vomiting most foods. No known sick contacts, fever, cough, CP, SOB, melena.      Past Medical History:  Diagnosis Date  . Heart murmur     There are no problems to display for this patient.   Past Surgical History:  Procedure Laterality Date  . TONSILLECTOMY         Home Medications    Prior to Admission medications   Medication Sig Start Date End Date Taking? Authorizing Provider  ibuprofen (ADVIL,MOTRIN) 200 MG tablet Take 200 mg by mouth every 6 (six) hours as needed for moderate pain.     [provider]  ondansetron (ZOFRAN) 4 MG tablet Take 1 tablet (4 mg total) by mouth every 6 (six) hours as needed for nausea. 01/03/20   Particia Nearing, PA-C  pantoprazole (PROTONIX) 40 MG tablet Take 1 tablet (40 mg total) by mouth daily. Take 30 min before breakfast daily until complete 01/03/20   Particia Nearing, PA-C  traMADol (ULTRAM) 50 MG tablet Take 1 tablet (50 mg total) by mouth every 6 (six) hours as needed. 08/08/14   Geoffery Lyons, MD    Family History No family history on file.  Social History Social History   Tobacco Use  . Smoking status: Current Some Day Smoker    Packs/day: 0.50    Types: Cigarettes  Substance Use Topics  . Alcohol use: Yes    Comment: occ  . Drug use: Not Currently    Types: Marijuana    Comment: quit 5/16     Allergies   Patient has no known allergies.   Review of Systems Review of Systems PER HPI   Physical Exam Triage Vital Signs ED Triage Vitals  Enc Vitals Group     BP 01/03/20 1058 (!) 147/91     Pulse Rate  01/03/20 1058 67     Resp 01/03/20 1058 18     Temp 01/03/20 1058 98.5 F (36.9 C)     Temp Source 01/03/20 1058 Oral     SpO2 01/03/20 1058 99 %     Weight 01/03/20 1059 (!) 320 lb (145.2 kg)     Height 01/03/20 1059 6\' 4"  (1.93 m)     Head Circumference --      Peak Flow --      Pain Score 01/03/20 1059 6     Pain Loc --      Pain Edu? --      Excl. in GC? --    No data found.  Updated Vital Signs BP (!) 147/91   Pulse 67   Temp 98.5 F (36.9 C) (Oral)   Resp 18   Ht 6\' 4"  (1.93 m)   Wt (!) 320 lb (145.2 kg)   SpO2 99%   BMI 38.95 kg/m   Visual Acuity Right Eye Distance:   Left Eye Distance:   Bilateral Distance:    Right Eye Near:   Left Eye Near:    Bilateral Near:     Physical Exam Vitals and nursing note reviewed.  Constitutional:  Appearance: Normal appearance.  HENT:     Head: Atraumatic.  Eyes:     Extraocular Movements: Extraocular movements intact.     Conjunctiva/sclera: Conjunctivae normal.  Cardiovascular:     Rate and Rhythm: Normal rate and regular rhythm.  Pulmonary:     Effort: Pulmonary effort is normal.     Breath sounds: Normal breath sounds.  Abdominal:     General: Bowel sounds are normal. There is no distension.     Palpations: Abdomen is soft.     Tenderness: There is abdominal tenderness (mild upper abdominal ttp diffusely). There is no right CVA tenderness, left CVA tenderness or guarding.  Musculoskeletal:        General: Normal range of motion.     Cervical back: Normal range of motion and neck supple.  Skin:    General: Skin is warm and dry.  Neurological:     General: No focal deficit present.     Mental Status: He is oriented to person, place, and time.  Psychiatric:        Mood and Affect: Mood normal.        Thought Content: Thought content normal.        Judgment: Judgment normal.      UC Treatments / Results  Labs (all labs ordered are listed, but only abnormal results are displayed) Labs Reviewed    SARS CORONAVIRUS 2 (TAT 6-24 HRS)    EKG   Radiology No results found.  Procedures Procedures (including critical care time)  Medications Ordered in UC Medications - No data to display  Initial Impression / Assessment and Plan / UC Course  I have reviewed the triage vital signs and the nursing notes.  Pertinent labs & imaging results that were available during my care of the patient were reviewed by me and considered in my medical decision making (see chart for details).     Suspect viral illness, will r/o COVID and isolate until results return/sxs resolve. Work note given. Vitals and exam reassuring today and he is tolerating PO fluids well. Will rx protonix and zofran to help with his nausea and epigastric discomfort. BRAT diet, push fluids, imodium prn. F/u if worsening or not improving.    Final Clinical Impressions(s) / UC Diagnoses   Final diagnoses:  Nausea vomiting and diarrhea  Epigastric pain     Discharge Instructions     Drink plenty of fluids, both plain water and electrolyte solutions such as gatorade and pedialite    ED Prescriptions    Medication Sig Dispense Auth. Provider   pantoprazole (PROTONIX) 40 MG tablet Take 1 tablet (40 mg total) by mouth daily. Take 30 min before breakfast daily until complete 30 tablet Particia Nearing, PA-C   ondansetron (ZOFRAN) 4 MG tablet Take 1 tablet (4 mg total) by mouth every 6 (six) hours as needed for nausea. 21 tablet Particia Nearing, [place]     PDMP not reviewed this encounter.   Particia Nearing, [place] 01/03/20 1159

## 2020-01-03 NOTE — ED Triage Notes (Signed)
Pt c/o 6/10 epigastric pressure, V/Dx3 days. Pt states vomited once and had 7 bouts of diarrhea.

## 2020-01-03 NOTE — Discharge Instructions (Signed)
Drink plenty of fluids, both plain water and electrolyte solutions such as gatorade and pedialite

## 2020-12-16 ENCOUNTER — Encounter (HOSPITAL_COMMUNITY): Payer: Self-pay

## 2020-12-16 ENCOUNTER — Ambulatory Visit (HOSPITAL_COMMUNITY)
Admission: EM | Admit: 2020-12-16 | Discharge: 2020-12-16 | Disposition: A | Discharge status: Home / Self Care | Payer: Medicaid Other | Attending: Emergency Medicine | Admitting: Emergency Medicine

## 2020-12-16 ENCOUNTER — Other Ambulatory Visit: Payer: Self-pay

## 2020-12-16 DIAGNOSIS — Z20822 Contact with and (suspected) exposure to covid-19: Secondary | ICD-10-CM | POA: Insufficient documentation

## 2020-12-16 DIAGNOSIS — J069 Acute upper respiratory infection, unspecified: Secondary | ICD-10-CM | POA: Insufficient documentation

## 2020-12-16 LAB — SARS CORONAVIRUS 2 (TAT 6-24 HRS): SARS Coronavirus 2: NEGATIVE

## 2020-12-16 MED ORDER — IBUPROFEN 800 MG PO TABS: 800.0000 mg | ORAL_TABLET | Freq: Three times a day (TID) | ORAL | 0 refills | Status: DC | PRN

## 2020-12-16 MED ORDER — PROMETHAZINE-DM 6.25-15 MG/5ML PO SYRP: 5.0000 mL | ORAL_SOLUTION | Freq: Four times a day (QID) | ORAL | 0 refills | Status: DC | PRN

## 2020-12-16 MED ORDER — BENZONATATE 100 MG PO CAPS: 100.0000 mg | ORAL_CAPSULE | Freq: Three times a day (TID) | ORAL | 0 refills | Status: DC

## 2020-12-16 MED ORDER — ALBUTEROL SULFATE HFA 108 (90 BASE) MCG/ACT IN AERS: 2.0000 | INHALATION_SPRAY | RESPIRATORY_TRACT | 0 refills | Status: DC | PRN

## 2020-12-16 NOTE — ED Triage Notes (Signed)
Pt was exposed to sick customer yesterday at work. Later in day started having sore throat and cough that is productive. Today having diarrhea and body aches as well.

## 2020-12-16 NOTE — Discharge Instructions (Addendum)
We will contact you if your COVID test is positive.  Please quarantine while you wait for the results.  If your test is negative you may resume normal activities.  If your test is positive please continue to quarantine for at least 5 days from your symptom onset or until you are without a fever for at least 24 hours after the medications.   You can take the Tessalon perles as needed for cough.  You can also take the Promethazine DM as needed for cough at night.  Promethazine DM can make you sleepy so don't take it prior to driving.    For cough: honey 1/2 to 1 teaspoon (you can dilute the honey in water or another fluid).  You can also use guaifenesin  for cough. You can use a humidifier for chest congestion and cough.  If you don't have a humidifier, you can sit in the bathroom with the hot shower running.      For sore throat: try warm salt water gargles, cepacol lozenges, throat spray, warm tea or water with lemon/honey, popsicles or ice, or OTC cold relief medicine for throat discomfort.   For congestion: take a daily anti-histamine like Zyrtec, Claritin, and a oral decongestant, such as pseudoephedrine.  You can also use Flonase 1-2 sprays in each nostril daily.   It is important to stay hydrated: drink plenty of fluids (water, gatorade/powerade/pedialyte, juices, or teas) to keep your throat moisturized and help further relieve irritation/discomfort.

## 2020-12-16 NOTE — ED Provider Notes (Signed)
[facility]    CSN: [id number] Arrival date & time: 12/16/20  4268      History   Chief Complaint Chief Complaint  Patient presents with  . Diarrhea  . Sore Throat  . Cough    HPI Hunter Sheppard is a 25 y.o. male.   Presents with sore throat, productive cough, intermittent generalized headache, left ear pain and fullness, soft diarrhea, body aches, chills, shortness of breath, wheezing for 1 day.  Poor appetite but tolerating liquids. known sick contacts.  Vaccinated.  Attempted use of NyQuil which did help him sleep.  Past Medical History:  Diagnosis Date  . Heart murmur     There are no problems to display for this patient.   Past Surgical History:  Procedure Laterality Date  . TONSILLECTOMY         Home Medications    Prior to Admission medications   Medication Sig Start Date End Date Taking? Authorizing Provider  albuterol (VENTOLIN HFA) 108 (90 Base) MCG/ACT inhaler Inhale 2 puffs into the lungs every 4 (four) hours as needed for wheezing or shortness of breath. 12/16/20  Yes Sarrah Fiorenza R, NP  benzonatate (TESSALON) 100 MG capsule Take 1 capsule (100 mg total) by mouth every 8 (eight) hours. 12/16/20  Yes Ahmira Boisselle R, NP  promethazine-dextromethorphan (PROMETHAZINE-DM) 6.25-15 MG/5ML syrup Take 5 mLs by mouth 4 (four) times daily as needed for cough. 12/16/20  Yes Graylon Amory R, NP  ibuprofen (ADVIL) 800 MG tablet Take 1 tablet (800 mg total) by mouth every 8 (eight) hours as needed for moderate pain. 12/16/20   Valinda Hoar, NP    Family History History reviewed. No pertinent family history.  Social History Social History   Tobacco Use  . Smoking status: Some Days    Packs/day: 0.50    Types: Cigarettes  . Smokeless tobacco: Never  Substance Use Topics  . Alcohol use: Yes    Comment: occ  . Drug use: Not Currently    Types: Marijuana    Comment: quit 5/16     Allergies   Patient has no known allergies.   Review  of Systems Review of Systems  Constitutional:  Positive for appetite change. Negative for activity change, chills, diaphoresis, fatigue, fever and unexpected weight change.  HENT:  Positive for congestion, ear pain, rhinorrhea and sore throat. Negative for dental problem, drooling, ear discharge, facial swelling, hearing loss, mouth sores, nosebleeds, postnasal drip, sinus pressure, sinus pain, sneezing, tinnitus, trouble swallowing and voice change.   Respiratory:  Positive for cough, shortness of breath and wheezing. Negative for apnea, choking, chest tightness and stridor.   Cardiovascular: Negative.   Gastrointestinal:  Positive for diarrhea and nausea. Negative for abdominal distention, abdominal pain, anal bleeding, blood in stool, constipation, rectal pain and vomiting.  Genitourinary: Negative.   Skin: Negative.   Neurological: Negative.     Physical Exam Triage Vital Signs ED Triage Vitals  Enc Vitals Group     BP 12/16/20 1006 (!) 149/92     Pulse Rate 12/16/20 1006 65     Resp 12/16/20 1006 18     Temp 12/16/20 1006 98.7 F (37.1 C)     Temp Source 12/16/20 1006 Oral     SpO2 12/16/20 1006 98 %     Weight --      Height --      Head Circumference --      Peak Flow --      Pain Score 12/16/20  1007 5     Pain Loc --      Pain Edu? --      Excl. in GC? --    No data found.  Updated Vital Signs BP (!) 149/92 (BP Location: Right Arm)   Pulse 65   Temp 98.7 F (37.1 C) (Oral)   Resp 18   SpO2 98%   Visual Acuity Right Eye Distance:   Left Eye Distance:   Bilateral Distance:    Right Eye Near:   Left Eye Near:    Bilateral Near:     Physical Exam Constitutional:      Appearance: Normal appearance. He is obese.  HENT:     Head: Normocephalic.     Right Ear: Tympanic membrane, ear canal and external ear normal.     Left Ear: Tympanic membrane, ear canal and external ear normal.     Nose: Congestion and rhinorrhea present.     Mouth/Throat:     Mouth:  Mucous membranes are moist.     Pharynx: Posterior oropharyngeal erythema present.  Eyes:     Extraocular Movements: Extraocular movements intact.  Cardiovascular:     Rate and Rhythm: Normal rate and regular rhythm.     Pulses: Normal pulses.     Heart sounds: Normal heart sounds.  Pulmonary:     Effort: Pulmonary effort is normal.     Breath sounds: Normal breath sounds.  Abdominal:     General: Abdomen is flat. Bowel sounds are normal.     Palpations: Abdomen is soft.  Musculoskeletal:        General: Normal range of motion.     Cervical back: Normal range of motion.  Lymphadenopathy:     Cervical: Cervical adenopathy present.  Skin:    General: Skin is warm and dry.  Neurological:     Mental Status: He is alert and oriented to person, place, and time. Mental status is at baseline.  Psychiatric:        Mood and Affect: Mood normal.        Behavior: Behavior normal.     UC Treatments / Results  Labs (all labs ordered are listed, but only abnormal results are displayed) Labs Reviewed  SARS CORONAVIRUS 2 (TAT 6-24 HRS)    EKG   Radiology No results found.  Procedures Procedures (including critical care time)  Medications Ordered in UC Medications - No data to display  Initial Impression / Assessment and Plan / UC Course  I have reviewed the triage vital signs and the nursing notes.  Pertinent labs & imaging results that were available during my care of the patient were reviewed by me and considered in my medical decision making (see chart for details).  Problems cough  Etiology of symptoms most likely viral, discussed with patient, verbalized understanding  1.  COVID test pending 2.  Tessalon 100 mg 3 times daily as needed 3.  Promethazine DM 6.25-15 mg / 5 mL every 4 hours as needed 4.  Albuterol inhaler 2 puffs every 4-6 hours as needed 5.  Ibuprofen 800 mg every 8 hours as needed Final Clinical Impressions(s) / UC Diagnoses   Final diagnoses:  Viral  URI with cough     Discharge Instructions      We will contact you if your COVID test is positive.  Please quarantine while you wait for the results.  If your test is negative you may resume normal activities.  If your test is positive please continue to quarantine  for at least 5 days from your symptom onset or until you are without a fever for at least 24 hours after the medications.   You can take the Tessalon perles as needed for cough.  You can also take the Promethazine DM as needed for cough at night.  Promethazine DM can make you sleepy so don't take it prior to driving.    For cough: honey 1/2 to 1 teaspoon (you can dilute the honey in water or another fluid).  You can also use guaifenesin  for cough. You can use a humidifier for chest congestion and cough.  If you don't have a humidifier, you can sit in the bathroom with the hot shower running.      For sore throat: try warm salt water gargles, cepacol lozenges, throat spray, warm tea or water with lemon/honey, popsicles or ice, or OTC cold relief medicine for throat discomfort.   For congestion: take a daily anti-histamine like Zyrtec, Claritin, and a oral decongestant, such as pseudoephedrine.  You can also use Flonase 1-2 sprays in each nostril daily.   It is important to stay hydrated: drink plenty of fluids (water, gatorade/powerade/pedialyte, juices, or teas) to keep your throat moisturized and help further relieve irritation/discomfort.      ED Prescriptions     Medication Sig Dispense Auth. Provider   ibuprofen (ADVIL) 800 MG tablet Take 1 tablet (800 mg total) by mouth every 8 (eight) hours as needed for moderate pain. 30 tablet Cerita Rabelo R, NP   promethazine-dextromethorphan (PROMETHAZINE-DM) 6.25-15 MG/5ML syrup Take 5 mLs by mouth 4 (four) times daily as needed for cough. 118 mL Milly Goggins R, NP   benzonatate (TESSALON) 100 MG capsule Take 1 capsule (100 mg total) by mouth every 8 (eight) hours. 21 capsule  Kathyann Spaugh R, NP   albuterol (VENTOLIN HFA) 108 (90 Base) MCG/ACT inhaler Inhale 2 puffs into the lungs every 4 (four) hours as needed for wheezing or shortness of breath. 18 g Valinda Hoar, NP      PDMP not reviewed this encounter.   Valinda Hoar, NP 12/16/20 1054

## 2021-02-20 ENCOUNTER — Ambulatory Visit (HOSPITAL_COMMUNITY)
Admission: EM | Admit: 2021-02-20 | Discharge: 2021-02-20 | Disposition: A | Discharge status: Home / Self Care | Payer: Medicaid Other | Attending: Internal Medicine | Admitting: Internal Medicine

## 2021-02-20 ENCOUNTER — Encounter (HOSPITAL_COMMUNITY): Payer: Self-pay

## 2021-02-20 ENCOUNTER — Other Ambulatory Visit: Payer: Self-pay

## 2021-02-20 DIAGNOSIS — R531 Weakness: Secondary | ICD-10-CM

## 2021-02-20 DIAGNOSIS — R42 Dizziness and giddiness: Secondary | ICD-10-CM

## 2021-02-20 LAB — CBC
HCT: 46.4 % (ref 39.0–52.0)
Hemoglobin: 16.1 g/dL (ref 13.0–17.0)
MCH: 31.4 pg (ref 26.0–34.0)
MCHC: 34.7 g/dL (ref 30.0–36.0)
MCV: 90.6 fL (ref 80.0–100.0)
Platelets: 381 10*3/uL (ref 150–400)
RBC: 5.12 MIL/uL (ref 4.22–5.81)
RDW: 11.6 % (ref 11.5–15.5)
WBC: 7.8 10*3/uL (ref 4.0–10.5)
nRBC: 0 % (ref 0.0–0.2)

## 2021-02-20 LAB — BASIC METABOLIC PANEL
Anion gap: 8 (ref 5–15)
BUN: 9 mg/dL (ref 6–20)
CO2: 27 mmol/L (ref 22–32)
Calcium: 9.5 mg/dL (ref 8.9–10.3)
Chloride: 103 mmol/L (ref 98–111)
Creatinine, Ser: 0.69 mg/dL (ref 0.61–1.24)
GFR, Estimated: >60 mL/min (ref >60)
Glucose, Bld: 119 mg/dL — ABNORMAL HIGH (ref 70–99)
Potassium: 4.9 mmol/L (ref 3.5–5.1)
Sodium: 138 mmol/L (ref 135–145)

## 2021-02-20 NOTE — ED Provider Notes (Signed)
[facility]    CSN: [id number] Arrival date & time: 02/20/21  0845      History   Chief Complaint Chief Complaint  Patient presents with   Chest Pain   Weakness    HPI Hunter Sheppard is a 25 y.o. male comes to the urgent care with an episode of chest tightness this morning.  Patient says chest tightness woke him out of bed.  He denies any chest pain.  No aggravating factors.  No relieving factors.  Is associated with some transient dizziness.  Dizziness happen when patient tries to go for a walk.  No ringing in the ears.  No ear fullness.  No sore throat.  No fever or chills.  No nausea or vomiting.  No diarrhea.  No history of cardiac disease.  No family history of heart disease.  No history of asthma  HPI  Past Medical History:  Diagnosis Date   Heart murmur     There are no problems to display for this patient.   Past Surgical History:  Procedure Laterality Date   TONSILLECTOMY         Home Medications    Prior to Admission medications   Medication Sig Start Date End Date Taking? Authorizing Provider  albuterol (VENTOLIN HFA) 108 (90 Base) MCG/ACT inhaler Inhale 2 puffs into the lungs every 4 (four) hours as needed for wheezing or shortness of breath. 12/16/20   White, Elita Boone, NP  benzonatate (TESSALON) 100 MG capsule Take 1 capsule (100 mg total) by mouth every 8 (eight) hours. 12/16/20   White, Elita Boone, NP  ibuprofen (ADVIL) 800 MG tablet Take 1 tablet (800 mg total) by mouth every 8 (eight) hours as needed for moderate pain. 12/16/20   Valinda Hoar, NP  promethazine-dextromethorphan (PROMETHAZINE-DM) 6.25-15 MG/5ML syrup Take 5 mLs by mouth 4 (four) times daily as needed for cough. 12/16/20   Valinda Hoar, NP    Family History Family History  Problem Relation Age of Onset   Diabetes Mother     Social History Social History   Tobacco Use   Smoking status: Some Days    Packs/day: 0.50    Types: Cigarettes   Smokeless tobacco:  Never  Substance Use Topics   Alcohol use: Yes    Comment: occ   Drug use: Not Currently    Types: Marijuana    Comment: quit 5/16     Allergies   Patient has no known allergies.   Review of Systems Review of Systems  HENT: Negative.  Negative for sore throat.   Eyes: Negative.   Respiratory:  Positive for chest tightness.   Gastrointestinal: Negative.   Genitourinary: Negative.   Neurological:  Positive for dizziness. Negative for light-headedness and headaches.    Physical Exam Triage Vital Signs ED Triage Vitals  Enc Vitals Group     BP 02/20/21 0858 139/88     Pulse Rate 02/20/21 0858 79     Resp 02/20/21 0858 16     Temp 02/20/21 0858 98.9 F (37.2 C)     Temp Source 02/20/21 0858 Oral     SpO2 02/20/21 0858 98 %     Weight --      Height --      Head Circumference --      Peak Flow --      Pain Score 02/20/21 0855 3     Pain Loc --      Pain Edu? --  Excl. in GC? --    No data found.  Updated Vital Signs BP 139/88 (BP Location: Right Arm)   Pulse 79   Temp 98.9 F (37.2 C) (Oral)   Resp 16   SpO2 98%   Visual Acuity Right Eye Distance:   Left Eye Distance:   Bilateral Distance:    Right Eye Near:   Left Eye Near:    Bilateral Near:     Physical Exam Vitals and nursing note reviewed.  Constitutional:      General: He is not in acute distress.    Appearance: He is not ill-appearing.  Cardiovascular:     Rate and Rhythm: Normal rate and regular rhythm.     Heart sounds: Normal heart sounds.  Pulmonary:     Effort: Pulmonary effort is normal.     Breath sounds: No decreased breath sounds, wheezing or rhonchi.  Abdominal:     Palpations: Abdomen is soft.  Neurological:     Mental Status: He is alert.     UC Treatments / Results  Labs (all labs ordered are listed, but only abnormal results are displayed) Labs Reviewed  CBC  BASIC METABOLIC PANEL    EKG   Radiology No results found.  Procedures Procedures (including  critical care time)  Medications Ordered in UC Medications - No data to display  Initial Impression / Assessment and Plan / UC Course  I have reviewed the triage vital signs and the nursing notes.  Pertinent labs & imaging results that were available during my care of the patient were reviewed by me and considered in my medical decision making (see chart for details).     1.  Generalized weakness with dizziness: EKG shows normal sinus rhythm Cardiac exam is negative for murmurs CBC, BMP Maintain adequate hydration We will call you with recommendations if labs are abnormal. Return to urgent care or go to ED if symptoms recur or are persistent. Final Clinical Impressions(s) / UC Diagnoses   Final diagnoses:  Generalized weakness  Dizziness     Discharge Instructions      Increase oral fluid intake We will call you with recommendations if labs are abnormal Go to the emergency department or return to the urgent care if your symptoms recurs or becomes persistent.   ED Prescriptions   None    PDMP not reviewed this encounter.   Merrilee Jansky, MD 02/20/21 1030

## 2021-02-20 NOTE — ED Triage Notes (Signed)
Pt presents with c/o chest pain and tightness that started at 0730. States he went for a walk right after and felt dizzy.

## 2021-02-20 NOTE — Discharge Instructions (Signed)
Increase oral fluid intake We will call you with recommendations if labs are abnormal Go to the emergency department or return to the urgent care if your symptoms recurs or becomes persistent.

## 2021-02-20 NOTE — ED Triage Notes (Signed)
Pt states his headache started after he started to feel dizzy. States on the way here the headache and dizziness did not go away.

## 2021-10-27 ENCOUNTER — Ambulatory Visit (HOSPITAL_COMMUNITY)
Admission: EM | Admit: 2021-10-27 | Discharge: 2021-10-27 | Disposition: A | Discharge status: Home / Self Care | Payer: Medicaid Other | Attending: Urgent Care | Admitting: Urgent Care

## 2021-10-27 ENCOUNTER — Encounter (HOSPITAL_COMMUNITY): Payer: Self-pay

## 2021-10-27 DIAGNOSIS — R0789 Other chest pain: Secondary | ICD-10-CM | POA: Insufficient documentation

## 2021-10-27 LAB — CBC WITH DIFFERENTIAL/PLATELET
Abs Immature Granulocytes: 0.04 10*3/uL (ref 0.00–0.07)
Basophils Absolute: 0.1 10*3/uL (ref 0.0–0.1)
Basophils Relative: 1 %
Eosinophils Absolute: 0.2 10*3/uL (ref 0.0–0.5)
Eosinophils Relative: 2 %
HCT: 47.4 % (ref 39.0–52.0)
Hemoglobin: 16.2 g/dL (ref 13.0–17.0)
Immature Granulocytes: 1 %
Lymphocytes Relative: 26 %
Lymphs Abs: 2.3 10*3/uL (ref 0.7–4.0)
MCH: 31.6 pg (ref 26.0–34.0)
MCHC: 34.2 g/dL (ref 30.0–36.0)
MCV: 92.6 fL (ref 80.0–100.0)
Monocytes Absolute: 0.9 10*3/uL (ref 0.1–1.0)
Monocytes Relative: 11 %
Neutro Abs: 5.4 10*3/uL (ref 1.7–7.7)
Neutrophils Relative %: 59 %
Platelets: 374 10*3/uL (ref 150–400)
RBC: 5.12 MIL/uL (ref 4.22–5.81)
RDW: 11.9 % (ref 11.5–15.5)
WBC: 8.9 10*3/uL (ref 4.0–10.5)
nRBC: 0 % (ref 0.0–0.2)

## 2021-10-27 LAB — CBG MONITORING, ED: Glucose-Capillary: 118 mg/dL — ABNORMAL HIGH (ref 70–99)

## 2021-10-27 MED ORDER — ALUM & MAG HYDROXIDE-SIMETH 200-200-20 MG/5ML PO SUSP
ORAL | Status: AC
  Filled 2021-10-27: qty 30

## 2021-10-27 MED ORDER — LIDOCAINE VISCOUS HCL 2 % MT SOLN
15.0000 mL | Freq: Once | OROMUCOSAL | Status: AC
  Administered 2021-10-27: 15 mL via ORAL

## 2021-10-27 MED ORDER — ALUM & MAG HYDROXIDE-SIMETH 200-200-20 MG/5ML PO SUSP
30.0000 mL | Freq: Once | ORAL | Status: AC
  Administered 2021-10-27: 30 mL via ORAL

## 2021-10-27 MED ORDER — PANTOPRAZOLE SODIUM 40 MG PO TBEC: 40.0000 mg | DELAYED_RELEASE_TABLET | Freq: Every day | ORAL | 0 refills | Status: AC

## 2021-10-27 MED ORDER — LIDOCAINE VISCOUS HCL 2 % MT SOLN
OROMUCOSAL | Status: AC
  Filled 2021-10-27: qty 15

## 2021-10-27 NOTE — Discharge Instructions (Signed)
Your ekg was consistent with prior EKGs performed. Your blood pressure is slightly higher than our goal, please follow up with a PCP to establish care and have your routine health evaluated. Your glucose was normal. Your CBC results will be available later this evening, will contact you with any concerns. I have called in a PPI for you to try, to see if the symptoms you were experiencing was related to a possible esophageal spasm. If any recurrence of symptoms, please head to ER for a comprehensive workup

## 2021-10-27 NOTE — ED Triage Notes (Signed)
Pt states was at work ago and developed center chest tightness radiating to rt chest, SOB, diaphoresis, and lightheadedness. States chest tightness never went away. No distress at this time, speaking in complete sentences, skin warm and dry.

## 2021-10-27 NOTE — ED Provider Notes (Signed)
[facility]    CSN: [id number] Arrival date & time: 10/27/21  1100      History   Chief Complaint Chief Complaint  Patient presents with   Chest Pain   Shortness of Breath    HPI Hunter Sheppard is a 26 y.o. male.   Pleasant 26 year old male with no known past medical history presents today due to feeling epigastric and right upper quadrant pain, shortness of breath, diaphoresis, and dizziness after reporting to work at 10 AM this morning.  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  He started having an epigastric/lower chest discomfort that he reported as a burning pain.  It radiated to the right upper quadrant.  He felt slightly short of breath and diaphoretic at that time, but states he started walking around the grocery store and those symptoms resolved.  The pain lasted until about 1030, in which it resolved spontaneously without treatment.  He states he did eat breakfast this morning of fried eggs and beans, which he normally does not eat breakfast.  He does smoke, but states a pack will take him 5 days.  He has done this for almost 10 years.  He reports a history of a cardiac murmur as a child, but denies any problems since.  Upon arrival in our clinic, he states his symptoms are resolved.  He denies fever, URI symptoms, nausea, vomiting, change in bowel movements.   Chest Pain Associated symptoms: shortness of breath   Shortness of Breath Associated symptoms: chest pain     Past Medical History:  Diagnosis Date   Heart murmur     There are no problems to display for this patient.   Past Surgical History:  Procedure Laterality Date   TONSILLECTOMY         Home Medications    Prior to Admission medications   Medication Sig Start Date End Date Taking? Authorizing Provider  pantoprazole (PROTONIX) 40 MG tablet Take 1 tablet (40 mg total) by mouth daily. 10/27/21  Yes Jann Milkovich L, PA    Family  History Family History  Problem Relation Age of Onset   Diabetes Mother     Social History Social History   Tobacco Use   Smoking status: Some Days    Packs/day: 0.50    Types: Cigarettes   Smokeless tobacco: Never  Substance Use Topics   Alcohol use: Yes    Comment: occ   Drug use: Not Currently    Types: Marijuana    Comment: quit 5/16     Allergies   Patient has no known allergies.   Review of Systems Review of Systems  Respiratory:  Positive for shortness of breath.   Cardiovascular:  Positive for chest pain.  As per HPI   Physical Exam Triage Vital Signs ED Triage Vitals  Enc Vitals Group     BP 10/27/21 1106 (!) 145/83     Pulse Rate 10/27/21 1106 77     Resp 10/27/21 1106 18     Temp 10/27/21 1106 98.6 F (37 C)     Temp Source 10/27/21 1106 Oral     SpO2 10/27/21 1106 96 %     Weight --      Height --      Head Circumference --      Peak Flow --      Pain Score 10/27/21 1107 5     Pain Loc --  Pain Edu? --      Excl. in GC? --    No data found.  Updated Vital Signs BP (!) 145/83 (BP Location: Left Arm)   Pulse 77   Temp 98.6 F (37 C) (Oral)   Resp 18   SpO2 96%   Visual Acuity Right Eye Distance:   Left Eye Distance:   Bilateral Distance:    Right Eye Near:   Left Eye Near:    Bilateral Near:     Physical Exam Vitals and nursing note reviewed.  Constitutional:      General: He is not in acute distress.    Appearance: He is well-developed. He is obese. He is not ill-appearing, toxic-appearing or diaphoretic (not actively, but shirt is moist).  HENT:     Head: Normocephalic and atraumatic.  Eyes:     Extraocular Movements: Extraocular movements intact.     Pupils: Pupils are equal, round, and reactive to light.     Comments: Slight conjunctival pallor noted  Neck:     Thyroid: No thyromegaly.  Cardiovascular:     Rate and Rhythm: Normal rate and regular rhythm. No extrasystoles are present.    Chest Wall: PMI is not  displaced.     Heart sounds: Normal heart sounds. Heart sounds not distant.  Pulmonary:     Effort: Pulmonary effort is normal. No tachypnea, accessory muscle usage or respiratory distress.     Breath sounds: Normal breath sounds. No stridor. No decreased breath sounds, wheezing, rhonchi or rales.  Chest:     Chest wall: No mass, deformity, tenderness, crepitus or edema. There is no dullness to percussion.  Abdominal:     General: Bowel sounds are normal.     Palpations: Abdomen is soft. There is no hepatomegaly or splenomegaly.     Tenderness: There is abdominal tenderness (mild reproducible tenderness to midline epigastric region). There is no guarding.     Comments: Negative murphy sign  Musculoskeletal:        General: Normal range of motion.     Cervical back: Normal range of motion and neck supple.     Right lower leg: No edema.     Left lower leg: No edema.  Lymphadenopathy:     Cervical: No cervical adenopathy.  Skin:    General: Skin is warm and dry.     Capillary Refill: Capillary refill takes less than 2 seconds.     Coloration: Skin is not cyanotic.     Findings: No erythema.     Nails: There is no clubbing.  Neurological:     General: No focal deficit present.     Mental Status: He is alert and oriented to person, place, and time.     Motor: No weakness.  Psychiatric:        Mood and Affect: Mood normal.        Behavior: Behavior normal.      UC Treatments / Results  Labs (all labs ordered are listed, but only abnormal results are displayed) Labs Reviewed  CBG MONITORING, ED - Abnormal; Notable for the following components:      Result Value   Glucose-Capillary 118 (*)    All other components within normal limits  CBC WITH DIFFERENTIAL/PLATELET    EKG NSR, rate 76 bpm, compared to Nov 2022  Radiology No results found.  Procedures Procedures (including critical care time)  Medications Ordered in UC Medications  alum & mag hydroxide-simeth  (MAALOX/MYLANTA) 200-200-20 MG/5ML suspension 30 mL (30 mLs  Oral Given 10/27/21 1138)    And  lidocaine (XYLOCAINE) 2 % viscous mouth solution 15 mL (15 mLs Oral Given 10/27/21 1139)    Initial Impression / Assessment and Plan / UC Course  I have reviewed the triage vital signs and the nursing notes.  Pertinent labs & imaging results that were available during my care of the patient were reviewed by me and considered in my medical decision making (see chart for details).  Clinical Course as of 10/27/21 1301  Mon Oct 27, 2021  1259 EKG 12-Lead [WC]    Clinical Course User Index [WC] Guy Sandifer L, [place]    Atypical chest pain - at time of presentation, symptoms had resolved. EKG does not show signs of acute ischemia. Based on symptom presentation, differentials include esophageal spasm, chest wall pain, hiatal hernia. GI cocktail given in office. Pt left asx. CBC ordered due to pallor noted, will call with results if concerning. PT needs to schedule follow up with PCP for routine health eval. Smoking cessation recommended to patient. Glucose not concerning as pt recently ate. If any new, or recurrent symptoms occur, pt instructed to head to ER.  Final Clinical Impressions(s) / UC Diagnoses   Final diagnoses:  Atypical chest pain     Discharge Instructions      Your ekg was consistent with prior EKGs performed. Your blood pressure is slightly higher than our goal, please follow up with a PCP to establish care and have your routine health evaluated. Your glucose was normal. Your CBC results will be available later this evening, will contact you with any concerns. I have called in a PPI for you to try, to see if the symptoms you were experiencing was related to a possible esophageal spasm. If any recurrence of symptoms, please head to ER for a comprehensive workup    ED Prescriptions     Medication Sig Dispense Auth. Provider   pantoprazole (PROTONIX) 40 MG tablet Take 1 tablet  (40 mg total) by mouth daily. 30 tablet Jermani Eberlein L, [place]      PDMP not reviewed this encounter.   Maretta Bees, [place] 10/27/21 1304
